# Patient Record
Sex: Male | Born: 1960 | Race: White | Hispanic: Yes | Marital: Married | Smoking: Current some day smoker
Health system: Southern US, Community
[De-identification: ages and names within clinical notes are randomized; demographics above are authoritative.]

---

## 2000-05-16 ENCOUNTER — Emergency Department (HOSPITAL_COMMUNITY): Admission: EM | Admit: 2000-05-16 | Discharge: 2000-05-16 | Payer: Self-pay | Admitting: Emergency Medicine

## 2001-05-08 ENCOUNTER — Inpatient Hospital Stay (HOSPITAL_COMMUNITY): Admission: EM | Admit: 2001-05-08 | Discharge: 2001-05-10 | Payer: Self-pay | Admitting: Emergency Medicine

## 2002-06-22 ENCOUNTER — Emergency Department (HOSPITAL_COMMUNITY): Admission: EM | Admit: 2002-06-22 | Discharge: 2002-06-22 | Payer: Self-pay | Admitting: Emergency Medicine

## 2002-06-25 ENCOUNTER — Emergency Department (HOSPITAL_COMMUNITY): Admission: EM | Admit: 2002-06-25 | Discharge: 2002-06-25 | Payer: Self-pay | Admitting: Emergency Medicine

## 2003-12-11 ENCOUNTER — Emergency Department (HOSPITAL_COMMUNITY): Admission: EM | Admit: 2003-12-11 | Discharge: 2003-12-11 | Payer: Self-pay | Admitting: Emergency Medicine

## 2004-12-01 ENCOUNTER — Emergency Department (HOSPITAL_COMMUNITY): Admission: EM | Admit: 2004-12-01 | Discharge: 2004-12-01 | Payer: Self-pay | Admitting: Emergency Medicine

## 2009-06-01 ENCOUNTER — Emergency Department (HOSPITAL_COMMUNITY): Admission: EM | Admit: 2009-06-01 | Discharge: 2009-06-02 | Payer: Self-pay | Admitting: Emergency Medicine

## 2010-09-25 ENCOUNTER — Emergency Department (HOSPITAL_COMMUNITY): Payer: Self-pay

## 2010-09-25 ENCOUNTER — Emergency Department (HOSPITAL_COMMUNITY)
Admission: EM | Admit: 2010-09-25 | Discharge: 2010-09-25 | Disposition: A | Discharge status: Home / Self Care | Payer: Self-pay | Attending: Emergency Medicine | Admitting: Emergency Medicine

## 2010-09-25 DIAGNOSIS — S022XXA Fracture of nasal bones, initial encounter for closed fracture: Secondary | ICD-10-CM | POA: Insufficient documentation

## 2010-09-25 DIAGNOSIS — IMO0002 Reserved for concepts with insufficient information to code with codable children: Secondary | ICD-10-CM | POA: Insufficient documentation

## 2010-09-25 DIAGNOSIS — S0003XA Contusion of scalp, initial encounter: Secondary | ICD-10-CM | POA: Insufficient documentation

## 2010-09-25 DIAGNOSIS — S0990XA Unspecified injury of head, initial encounter: Secondary | ICD-10-CM | POA: Insufficient documentation

## 2010-09-25 DIAGNOSIS — X58XXXA Exposure to other specified factors, initial encounter: Secondary | ICD-10-CM | POA: Insufficient documentation

## 2010-09-25 DIAGNOSIS — S1093XA Contusion of unspecified part of neck, initial encounter: Secondary | ICD-10-CM | POA: Insufficient documentation

## 2015-03-31 ENCOUNTER — Emergency Department (HOSPITAL_COMMUNITY)
Admission: EM | Admit: 2015-03-31 | Discharge: 2015-03-31 | Disposition: A | Discharge status: Home / Self Care | Payer: Self-pay | Attending: Emergency Medicine | Admitting: Emergency Medicine

## 2015-03-31 ENCOUNTER — Encounter (HOSPITAL_COMMUNITY): Payer: Self-pay | Admitting: *Deleted

## 2015-03-31 DIAGNOSIS — R21 Rash and other nonspecific skin eruption: Secondary | ICD-10-CM

## 2015-03-31 DIAGNOSIS — F17200 Nicotine dependence, unspecified, uncomplicated: Secondary | ICD-10-CM | POA: Insufficient documentation

## 2015-03-31 DIAGNOSIS — L509 Urticaria, unspecified: Secondary | ICD-10-CM | POA: Insufficient documentation

## 2015-03-31 MED ORDER — DIPHENHYDRAMINE HCL 25 MG PO TABS: 25.0000 mg | ORAL_TABLET | Freq: Four times a day (QID) | ORAL | Status: DC

## 2015-03-31 MED ORDER — PREDNISONE 50 MG PO TABS: ORAL_TABLET | ORAL | Status: DC

## 2015-03-31 NOTE — Discharge Instructions (Signed)
Use the benadryl and prednisone as prescribed. [facility] if [facility]. Return to ED with fevers, difficulty breathing, difficulty swallowing, swelling of the face or any other new or concerning symptoms

## 2015-03-31 NOTE — ED Provider Notes (Signed)
CSN: [id number]     Arrival date & time 03/31/15  1417 History  By signing my name below, I, Jaime Acevedo, attest that this documentation has been prepared under the direction and in the presence of Jaime Heimlich, PA-C Electronically Signed: Soijett Acevedo, ED Scribe. 03/31/2015. 3:59 PM.   Chief Complaint  Patient presents with  . Rash   The history is provided by the patient. A language interpreter was used ([place]).   Jaime Acevedo is a 54 y.o. male who presents to the Emergency Department complaining of itchy, red, rash to his torso and bilateral arms x yesterday afternoon. The rash is itchy but is not painful. The rash has not spread. Pt thinks that he ate pork and beef yesterday which he thinks is contributing to the rash, although these foods are not new for him. Pt denies new soaps/medications/pets/environment/lotion/detergent or other recent changes to his daily routine. He notes that he has tried abx and benadryl this morning with no relief of his symptoms. He denies wound, SOB, CP, difficulty breathing, difficulty swallowing and any other symptoms. Denies anyone at home with similar rash.   History reviewed. No pertinent past medical history. History reviewed. No pertinent past surgical history. History reviewed. No pertinent family history. Social History  Substance Use Topics  . Smoking status: Current Some Day Smoker  . Smokeless tobacco: None  . Alcohol Use: Yes     Comment: occ    Review of Systems  Skin: Positive for rash (itchy rash to torso and bilateral arms).  All other systems reviewed and are negative.     Allergies  Review of patient's allergies indicates no known allergies.  Home Medications   Prior to Admission medications   Medication Sig Start Date End Date Taking? Authorizing Provider  diphenhydrAMINE (BENADRYL) 25 MG tablet Take 1 tablet (25 mg total) by mouth every 6 (six) hours. 03/31/15   Rolm Gala Narciso Stoutenburg, PA-C  predniSONE (DELTASONE) 50  MG tablet Take 1 tablet once a day for 5 days 03/31/15   Braydn Carneiro, PA-C   BP 150/85 mmHg  Pulse 69  Temp(Src) 98.7 F (37.1 C) (Oral)  Resp 20  SpO2 98% Physical Exam  Constitutional: He appears well-developed and well-nourished. No distress.  HENT:  Head: Normocephalic and atraumatic.  Right Ear: External ear normal.  Left Ear: External ear normal.  Mouth/Throat: Uvula is midline, oropharynx is clear and moist and mucous membranes are normal. Mucous membranes are not dry. No posterior oropharyngeal edema or posterior oropharyngeal erythema.  No swelling of the eyelids, lips or tongue.   Eyes: Conjunctivae are normal. Right eye exhibits no discharge. Left eye exhibits no discharge. No scleral icterus.  Neck: Normal range of motion. Neck supple.  No stridor. FROM of neck intact.   Cardiovascular: Normal rate, regular rhythm and normal heart sounds.   Pulmonary/Chest: Effort normal and breath sounds normal. No respiratory distress. He has no wheezes. He has no rales.  Breathing unlabored. Lungs CTAB  Abdominal: Soft. He exhibits no distension. There is no tenderness. There is no rebound and no guarding.  Musculoskeletal: Normal range of motion.  Moves all extremities spontaneously  Neurological: He is alert. Coordination normal.  Skin: Skin is warm and dry. Rash noted.     Diffuse urticaria over abdomen, bilateral flanks and bilateral upper arms. Rash is not TTP. No vesicles, pustules, desquamation, abscesses or bulla overlying rash.   Psychiatric: He has a normal mood and affect. His behavior is normal.  Nursing note and vitals  reviewed.   ED Course  Procedures (including critical care time) DIAGNOSTIC STUDIES: Oxygen Saturation is 99% on RA, nl by my interpretation.    COORDINATION OF CARE: 3:58 PM Discussed treatment plan with pt at bedside and pt agreed to plan.    Labs Review Labs Reviewed - No data to display  Imaging Review No results found.    EKG  Interpretation None      MDM   Final diagnoses:  Rash  Urticaria   Rash consistent with urticaria. Patient denies any difficulty breathing or swallowing.  Pt has a patent airway without stridor and is handling secretions without difficulty; no angioedema. No blisters, no pustules, no warmth, no draining sinus tracts, no superficial abscesses, no bullous impetigo, no vesicles, no desquamation, no target lesions with dusky purpura or a central bulla. Not tender to touch. No concern for superimposed infection. No concern for SJS, TEN, TSS, tick borne illness, syphilis or other life-threatening condition. Will discharge home with short course of steroids and benadryl. Return precautions given in discharge paperwork and discussed with pt at bedside. Pt stable for discharge  I personally performed the services described in this documentation, which was scribed in my presence. The recorded information has been reviewed and is accurate.    Jaime HeimlichStevi Jadrien Narine, PA-C 03/31/15 1628  Eber HongBrian Miller, MD 04/02/15 1004

## 2015-03-31 NOTE — ED Notes (Signed)
Declined W/C at D/C and was escorted to lobby by RN. 

## 2015-03-31 NOTE — ED Notes (Signed)
Pt reports rash and itching to torso and arms since last night.

## 2015-04-07 ENCOUNTER — Encounter (HOSPITAL_COMMUNITY): Payer: Self-pay | Admitting: Emergency Medicine

## 2015-04-07 ENCOUNTER — Emergency Department (HOSPITAL_COMMUNITY)
Admission: EM | Admit: 2015-04-07 | Discharge: 2015-04-07 | Disposition: A | Discharge status: Home / Self Care | Payer: Self-pay | Attending: Emergency Medicine | Admitting: Emergency Medicine

## 2015-04-07 DIAGNOSIS — Y9389 Activity, other specified: Secondary | ICD-10-CM | POA: Insufficient documentation

## 2015-04-07 DIAGNOSIS — Y9289 Other specified places as the place of occurrence of the external cause: Secondary | ICD-10-CM | POA: Insufficient documentation

## 2015-04-07 DIAGNOSIS — T7840XA Allergy, unspecified, initial encounter: Secondary | ICD-10-CM | POA: Insufficient documentation

## 2015-04-07 DIAGNOSIS — F17200 Nicotine dependence, unspecified, uncomplicated: Secondary | ICD-10-CM | POA: Insufficient documentation

## 2015-04-07 DIAGNOSIS — Y998 Other external cause status: Secondary | ICD-10-CM | POA: Insufficient documentation

## 2015-04-07 DIAGNOSIS — X58XXXA Exposure to other specified factors, initial encounter: Secondary | ICD-10-CM | POA: Insufficient documentation

## 2015-04-07 MED ORDER — PREDNISONE 20 MG PO TABS: 20.0000 mg | ORAL_TABLET | Freq: Every day | ORAL | Status: AC

## 2015-04-07 MED ORDER — PREDNISONE 20 MG PO TABS
20.0000 mg | ORAL_TABLET | Freq: Once | ORAL | Status: AC
  Administered 2015-04-07: 20 mg via ORAL
  Filled 2015-04-07: qty 1

## 2015-04-07 MED ORDER — DIPHENHYDRAMINE HCL 25 MG PO CAPS
25.0000 mg | ORAL_CAPSULE | Freq: Once | ORAL | Status: AC
  Administered 2015-04-07: 25 mg via ORAL
  Filled 2015-04-07: qty 1

## 2015-04-07 MED ORDER — FAMOTIDINE 20 MG PO TABS
20.0000 mg | ORAL_TABLET | Freq: Once | ORAL | Status: AC
Start: 2015-04-07 — End: 2015-04-07
  Administered 2015-04-07: 20 mg via ORAL
  Filled 2015-04-07: qty 1

## 2015-04-07 MED ORDER — FAMOTIDINE 20 MG PO TABS: 20.0000 mg | ORAL_TABLET | Freq: Every day | ORAL | Status: AC

## 2015-04-07 MED ORDER — DIPHENHYDRAMINE HCL 25 MG PO CAPS: 25.0000 mg | ORAL_CAPSULE | Freq: Four times a day (QID) | ORAL | Status: AC | PRN

## 2015-04-07 NOTE — ED Provider Notes (Signed)
CSN: [id number]     Arrival date & time 04/07/15  4132 History   First MD Initiated Contact with Patient 04/07/15 0759     Chief Complaint  Patient presents with  . Rash     (Consider location/radiation/quality/duration/timing/severity/associated sxs/prior Treatment) HPI  She is a 54 year old malewith a past medical history who presents to the emergency department complaining of an itching, red rash across his torso. Patient reports that the rash started on 03/30/15. Patient was evaluated in the emergency department the following day. He was started on steroids and Benadryl, told to return to the emergency department if the rash did not improve following treatment. States the rash improved a mild amount since that time but continues to have itching. Denies shortness of breath, chest pain, difficulty breathing, difficulty swallowing, nausea, vomiting, abdominal pain. Nobody else in the household has a similar rash. Does not take any medications outside of the steroids and Benadryl. Denies having a similar rash in the past. Denies any new soaps, pets, detergents, lotions.   History reviewed. No pertinent past medical history. History reviewed. No pertinent past surgical history. History reviewed. No pertinent family history. Social History  Substance Use Topics  . Smoking status: Current Some Day Smoker  . Smokeless tobacco: None  . Alcohol Use: Yes     Comment: occ    Review of Systems  Constitutional: Negative for fever and appetite change.  HENT: Negative for congestion.   Eyes: Negative for visual disturbance.  Respiratory: Negative for chest tightness, shortness of breath and wheezing.   Cardiovascular: Negative for chest pain, palpitations and leg swelling.  Gastrointestinal: Negative for nausea, vomiting and abdominal pain.  Genitourinary: Negative for dysuria, flank pain and decreased urine volume.  Musculoskeletal: Negative for back pain.  Skin: Positive for rash. Negative for  wound.  Neurological: Negative for dizziness, seizures, weakness, light-headedness and headaches.  Psychiatric/Behavioral: Negative for behavioral problems.      Allergies  Review of patient's allergies indicates no known allergies.  Home Medications   Prior to Admission medications   Medication Sig Start Date End Date Taking? Authorizing Provider  diphenhydrAMINE (BENADRYL) 25 mg capsule Take 1 capsule (25 mg total) by mouth every 6 (six) hours as needed for itching. 04/07/15 04/14/15  Corena Herter, MD  famotidine (PEPCID) 20 MG tablet Take 1 tablet (20 mg total) by mouth daily. 04/07/15 04/21/15  Corena Herter, MD  predniSONE (DELTASONE) 20 MG tablet Take 1 tablet (20 mg total) by mouth daily with breakfast. 04/07/15 04/14/15  Corena Herter, MD   BP 153/106 mmHg  Pulse 64  Temp(Src) 97.9 F (36.6 C)  Resp 18  SpO2 98% Physical Exam  Constitutional: He is oriented to person, place, and time. He appears well-developed and well-nourished. No distress.  HENT:  Head: Normocephalic and atraumatic.  Mouth/Throat: Oropharynx is clear and moist.  Eyes: Conjunctivae and EOM are normal. Pupils are equal, round, and reactive to light.  Neck: Normal range of motion. Neck supple. No JVD present. No tracheal deviation present.  Cardiovascular: Normal rate, regular rhythm, normal heart sounds and intact distal pulses.   Pulmonary/Chest: Effort normal and breath sounds normal. No respiratory distress. He has no wheezes. He exhibits no tenderness.  Abdominal: Soft. Bowel sounds are normal. He exhibits no distension. There is no tenderness. There is no rebound.  Musculoskeletal: Normal range of motion.  Neurological: He is alert and oriented to person, place, and time.  Skin: Skin is warm. Rash noted. Rash is urticarial (across lower torso and  bilateral arms).  Psychiatric: He has a normal mood and affect.  Nursing note and vitals reviewed.   ED Course  Procedures (including critical care  time) Labs Review Labs Reviewed - No data to display  Imaging Review No results found. I have personally reviewed and evaluated these images and lab results as part of my medical decision-making.   EKG Interpretation None      MDM   Final diagnoses:  Allergic reaction, initial encounter   Patient is a 54 year old male with no significant past medical history who presents to the emergency department with a pruritic rash of his lower abdomen.On arrival no acute distress, not ill appearing. Afebrile, hemodynamic likely stable. Patient has been treated with a 4 day course of prednisone 40 mg daily and benadryl. Minimal improvement of rash. Patient without signs or symptoms of anaphylaxis. Does not require epinephrine at this time. Patient initially did not presents with anaphylaxis. Uncertain the cause of the patient's allergic reaction, no medication changes. No signs of contact dermatitis. Patient given a prescription for prednisone 20 mg daily, did not tolerate higher dose of steroid. Given prescription for steroids, zantac, benadryl. Given follow-up information for an allergy clinic for allergy testing. Given strict return precautions to the emergency department. Patient expressed understanding, no questions or concerns at time of discharge.      Corena HerterShannon Dmari Schubring, MD 04/07/15 [id number]  Gerhard Munchobert Lockwood, MD 04/10/15 2015

## 2015-04-07 NOTE — ED Notes (Signed)
Pt here today with recurrent rash , pt was here on the 28th and given prednisone and benadryl he stated that the rash had got better but came back

## 2016-07-20 ENCOUNTER — Emergency Department (HOSPITAL_COMMUNITY)
Admission: EM | Admit: 2016-07-20 | Discharge: 2016-07-20 | Disposition: A | Discharge status: Home / Self Care | Payer: Self-pay | Attending: Emergency Medicine | Admitting: Emergency Medicine

## 2016-07-20 ENCOUNTER — Encounter (HOSPITAL_COMMUNITY): Payer: Self-pay | Admitting: *Deleted

## 2016-07-20 DIAGNOSIS — H5789 Other specified disorders of eye and adnexa: Secondary | ICD-10-CM

## 2016-07-20 DIAGNOSIS — F17200 Nicotine dependence, unspecified, uncomplicated: Secondary | ICD-10-CM | POA: Insufficient documentation

## 2016-07-20 DIAGNOSIS — R21 Rash and other nonspecific skin eruption: Secondary | ICD-10-CM | POA: Insufficient documentation

## 2016-07-20 DIAGNOSIS — H578 Other specified disorders of eye and adnexa: Secondary | ICD-10-CM | POA: Insufficient documentation

## 2016-07-20 MED ORDER — KETOCONAZOLE 2 % EX CREA: 1.0000 "application " | TOPICAL_CREAM | Freq: Every day | CUTANEOUS | 0 refills | Status: AC

## 2016-07-20 MED ORDER — NAPHAZOLINE-PHENIRAMINE 0.025-0.3 % OP SOLN
1.0000 [drp] | OPHTHALMIC | 0 refills | Status: AC | PRN
Start: 2016-07-20 — End: ?

## 2016-07-20 MED ORDER — TRIAMCINOLONE ACETONIDE 0.1 % EX CREA: 1.0000 "application " | TOPICAL_CREAM | Freq: Two times a day (BID) | CUTANEOUS | 0 refills | Status: AC

## 2016-07-20 NOTE — Discharge Instructions (Signed)
You can use the ointment as prescribed to help with the rash in your groin region. Eyedrops as prescribed to help with irritation in your eyes. It is also important for you to wear protective eye wear while working. With your doctor. Return to ED for new or worsening symptoms.

## 2016-07-20 NOTE — ED Provider Notes (Signed)
[facility]-EMERGENCY DEPT Provider Note   CSN: [id number] Arrival date & time: 07/20/16  4098  By signing my name below, I, Rosario Adie, attest that this documentation has been prepared under the direction and in the presence of Joycie Peek, PA-C.  Electronically Signed: Rosario Adie, ED Scribe. 07/20/16. 11:10 AM.  History   Chief Complaint Chief Complaint  Patient presents with  . Eye Problem   The history is provided by the patient. No language interpreter was used.   HPI Comments: Jeremian Treylen Gibbs is a 56 y.o. male with no pertinent PMHx, who presents to the Emergency Department complaining of persistent bilateral eye Itchiness beginning two weeks ago. 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 No treatments for this issue were tried prior to coming into the ED. He denies eye pain, vision changes or any other associated symptoms.  He is secondarily c/o intermittent groin itchiness beginning "25 days ago". Pt has been applying "some cream" with some relief of this issue. He denies testicular pain, testicular swelling, fever, chills.    History reviewed. No pertinent past medical history.  There are no active problems to display for this patient.  History reviewed. No pertinent surgical history.  Home Medications    Prior to Admission medications   Medication Sig Start Date End Date Taking? Authorizing Provider  diphenhydrAMINE (BENADRYL) 25 mg capsule Take 1 capsule (25 mg total) by mouth every 6 (six) hours as needed for itching. 04/07/15 04/14/15  Corena Herter, MD  famotidine (PEPCID) 20 MG tablet Take 1 tablet (20 mg total) by mouth daily. 04/07/15 04/21/15  Corena Herter, MD  ketoconazole (NIZORAL) 2 % cream Apply 1 application topically daily. 07/20/16   Joycie Peek, PA-C  naphazoline-pheniramine (NAPHCON-A) 0.025-0.3 % ophthalmic solution Place 1 drop into both eyes every 4  (four) hours as needed for irritation. 07/20/16   Joycie Peek, PA-C  triamcinolone cream (KENALOG) 0.1 % Apply 1 application topically 2 (two) times daily. 07/20/16   Joycie Peek, PA-C   Family History History reviewed. No pertinent family history.  Social History Social History  Substance Use Topics  . Smoking status: Current Some Day Smoker  . Smokeless tobacco: Never Used  . Alcohol use Yes     Comment: occ   Allergies   Patient has no known allergies.  Review of Systems Review of Systems A complete review of systems was obtained and all systems are negative except as noted in the HPI and PMH.   Physical Exam Updated Vital Signs BP (!) 152/81 (BP Location: Left Arm)   Pulse 69   Temp 98 F (36.7 C) (Oral)   Resp 16   SpO2 99%   Physical Exam  Constitutional: He appears well-developed and well-nourished. No distress.  HENT:  Head: Normocephalic and atraumatic.  Eyes: Conjunctivae and EOM are normal. Pupils are equal, round, and reactive to light. Right eye exhibits no discharge. Left eye exhibits no discharge. No scleral icterus.  Neck: Normal range of motion.  Cardiovascular: Normal rate.   Pulmonary/Chest: Effort normal.  Abdominal: He exhibits no distension.  Genitourinary:  Genitourinary Comments: Chaperone present throughout entire exam.  Bilateral medial thigh with keratotic lesions that are hyperpigmented at same level. No genital lesions.  Musculoskeletal: Normal range of motion.  Neurological: He is alert.  Skin: No pallor.  Psychiatric: He has a normal mood and affect. His behavior is normal.  Nursing note and vitals reviewed.  ED Treatments /  Results  DIAGNOSTIC STUDIES: Oxygen Saturation is 99% on RA, normal by my interpretation.   COORDINATION OF CARE: 11:10 AM-Discussed next steps with pt. Pt verbalized understanding and is agreeable with the plan.   Labs (all labs ordered are listed, but only abnormal results are displayed) Labs  Reviewed - No data to display  EKG  EKG Interpretation None      Radiology No results found.  Procedures Procedures   Medications Ordered in ED Medications - No data to display  Initial Impression / Assessment and Plan / ED Course  I have reviewed the triage vital signs and the nursing notes.  Pertinent labs & imaging results that were available during my care of the patient were reviewed by me and considered in my medical decision making (see chart for details).    Naphcon for eye itchiness, discussed wearing protective eyewear. Rash with possible fungal component versus friction of legs/clothing. Rx triamcinolone and ketoconazole. Discussed continued hygiene. Follow-up with PCP. Return precautions discussed  Final Clinical Impressions(s) / ED Diagnoses   Final diagnoses:  Eye irritation  Rash of genital area   New Prescriptions Discharge Medication List as of 07/20/2016 11:52 AM    START taking these medications   Details  ketoconazole (NIZORAL) 2 % cream Apply 1 application topically daily., Starting Tue 07/20/2016, Print    naphazoline-pheniramine (NAPHCON-A) 0.025-0.3 % ophthalmic solution Place 1 drop into both eyes every 4 (four) hours as needed for irritation., Starting Tue 07/20/2016, Print    triamcinolone cream (KENALOG) 0.1 % Apply 1 application topically 2 (two) times daily., Starting Tue 07/20/2016, Print       I personally performed the services described in this documentation, which was scribed in my presence. The recorded information has been reviewed and is accurate.     Joycie PeekBenjamin Sharnell Knight, PA-C 07/20/16 1555    Gerhard Munchobert Lockwood, MD 07/20/16 [phone]

## 2016-07-20 NOTE — ED Triage Notes (Signed)
c/o both eyes itching and also itching in his groin area onset 1 month ago

## 2021-01-27 ENCOUNTER — Other Ambulatory Visit: Payer: Self-pay

## 2021-01-27 ENCOUNTER — Emergency Department (HOSPITAL_COMMUNITY)
Admission: EM | Admit: 2021-01-27 | Discharge: 2021-01-28 | Disposition: A | Discharge status: Home / Self Care | Payer: Self-pay | Attending: Emergency Medicine | Admitting: Emergency Medicine

## 2021-01-27 ENCOUNTER — Emergency Department (HOSPITAL_COMMUNITY): Payer: Self-pay

## 2021-01-27 DIAGNOSIS — W11XXXA Fall on and from ladder, initial encounter: Secondary | ICD-10-CM | POA: Insufficient documentation

## 2021-01-27 DIAGNOSIS — W19XXXA Unspecified fall, initial encounter: Secondary | ICD-10-CM

## 2021-01-27 DIAGNOSIS — F17200 Nicotine dependence, unspecified, uncomplicated: Secondary | ICD-10-CM | POA: Insufficient documentation

## 2021-01-27 DIAGNOSIS — S31010A Laceration without foreign body of lower back and pelvis without penetration into retroperitoneum, initial encounter: Secondary | ICD-10-CM | POA: Insufficient documentation

## 2021-01-27 DIAGNOSIS — Z23 Encounter for immunization: Secondary | ICD-10-CM | POA: Insufficient documentation

## 2021-01-27 MED ORDER — TETANUS-DIPHTH-ACELL PERTUSSIS 5-2.5-18.5 LF-MCG/0.5 IM SUSY
0.5000 mL | PREFILLED_SYRINGE | Freq: Once | INTRAMUSCULAR | Status: AC
  Administered 2021-01-28: 0.5 mL via INTRAMUSCULAR
  Filled 2021-01-27: qty 0.5

## 2021-01-27 NOTE — ED Triage Notes (Signed)
Pt c/o lac to R lower abd/flank. States he was on a ladder, fell approx 4-45ft onto dirt, "scraped" ladder on the way down. Bleeding oozing, controlled in triage.   Pt states 2/10 pain, "doesn't hurt unless you touch it."  Tetanus not up to date

## 2021-01-28 ENCOUNTER — Emergency Department (HOSPITAL_COMMUNITY): Payer: Self-pay

## 2021-01-28 MED ORDER — LIDOCAINE-EPINEPHRINE (PF) 2 %-1:200000 IJ SOLN
10.0000 mL | Freq: Once | INTRAMUSCULAR | Status: AC
  Administered 2021-01-28: 10 mL via INTRADERMAL
  Filled 2021-01-28: qty 20

## 2021-01-28 NOTE — ED Notes (Signed)
Pt discharged and wheeled out of the ED in a wheel chair without difficulty. 

## 2021-01-28 NOTE — ED Provider Notes (Signed)
[facility] EMERGENCY DEPARTMENT Provider Note   CSN: [id number] Arrival date & time: 01/27/21  1617     History Chief Complaint  Patient presents with   Laceration    Grant Ziad Maye is a 60 y.o. male.  60 yo M with a chief complaints of falling down a ladder.  Larey Seat about 4 to 6 feet and scraped the ladder on the way down.  Suffered a laceration to the right side of his body.  Denies any abdominal pain.  Is been able to walk afterwards without issue.  Denies head injury or loss consciousness.  Feels that the area was mostly clean on the ladder.  The history is provided by the patient and a friend. The history is limited by a language barrier. A language interpreter was used.  Laceration Location:  Pelvis Pelvic laceration location:  R hip Length:  5.6 Depth:  Through dermis Quality: jagged   Bleeding: venous   Time since incident:  12 hours Laceration mechanism:  Blunt object Pain details:    Quality:  Aching and burning   Severity:  Mild   Timing:  Constant   Progression:  Unchanged Foreign body present:  No foreign bodies Relieved by:  Nothing Worsened by:  Movement and pressure Ineffective treatments:  None tried Tetanus status:  Unknown Associated symptoms: no fever and no rash       No past medical history on file.  There are no problems to display for this patient.   No past surgical history on file.     No family history on file.  Social History   Tobacco Use   Smoking status: Some Days   Smokeless tobacco: Never  Substance Use Topics   Alcohol use: Yes    Comment: occ   Drug use: No    Home Medications Prior to Admission medications   Medication Sig Start Date End Date Taking? Authorizing Provider  diphenhydrAMINE (BENADRYL) 25 mg capsule Take 1 capsule (25 mg total) by mouth every 6 (six) hours as needed for itching. 04/07/15 04/14/15  Corena Herter, MD  famotidine (PEPCID) 20 MG tablet Take 1 tablet (20 mg  total) by mouth daily. 04/07/15 04/21/15  Corena Herter, MD  ketoconazole (NIZORAL) 2 % cream Apply 1 application topically daily. 07/20/16   Cartner, Sharlet Salina, PA-C  naphazoline-pheniramine (NAPHCON-A) 0.025-0.3 % ophthalmic solution Place 1 drop into both eyes every 4 (four) hours as needed for irritation. 07/20/16   Cartner, Sharlet Salina, PA-C  triamcinolone cream (KENALOG) 0.1 % Apply 1 application topically 2 (two) times daily. 07/20/16   Joycie Peek, PA-C    Allergies    Patient has no known allergies.  Review of Systems   Review of Systems  Constitutional:  Negative for chills and fever.  HENT:  Negative for congestion and facial swelling.   Eyes:  Negative for discharge and visual disturbance.  Respiratory:  Negative for shortness of breath.   Cardiovascular:  Negative for chest pain and palpitations.  Gastrointestinal:  Negative for abdominal pain, diarrhea and vomiting.  Musculoskeletal:  Negative for arthralgias and myalgias.  Skin:  Positive for wound. Negative for color change and rash.  Neurological:  Negative for tremors, syncope and headaches.  Psychiatric/Behavioral:  Negative for confusion and dysphoric mood.    Physical Exam Updated Vital Signs BP (!) 152/89 (BP Location: Right Arm)   Pulse 99   Temp 98.1 F (36.7 C) (Oral)   Resp 16   SpO2 98%   Physical Exam Vitals and nursing  note reviewed.  Constitutional:      Appearance: He is well-developed.  HENT:     Head: Normocephalic and atraumatic.  Eyes:     Pupils: Pupils are equal, round, and reactive to light.  Neck:     Vascular: No JVD.  Cardiovascular:     Rate and Rhythm: Normal rate and regular rhythm.     Heart sounds: No murmur heard.   No friction rub. No gallop.  Pulmonary:     Effort: No respiratory distress.     Breath sounds: No wheezing.  Abdominal:     General: There is no distension.     Tenderness: There is no abdominal tenderness. There is no guarding or rebound.  Musculoskeletal:         General: Normal range of motion.     Cervical back: Normal range of motion and neck supple.     Comments: 2 lacerations overlying the iliac crest on the right.  Puncture wounds.  Mild edema.  Trace venous oozing.  No bony tenderness.  No tenderness on palpation of the abdomen.  Skin:    Coloration: Skin is not pale.     Findings: No rash.  Neurological:     Mental Status: He is alert and oriented to person, place, and time.  Psychiatric:        Behavior: Behavior normal.    ED Results / Procedures / Treatments   Labs (all labs ordered are listed, but only abnormal results are displayed) Labs Reviewed - No data to display  EKG None  Radiology DG Shoulder Right  Result Date: 01/27/2021 CLINICAL DATA:  Larey Seat off ladder EXAM: RIGHT SHOULDER - 2+ VIEW COMPARISON:  None. FINDINGS: No fracture or malalignment. Mild AC joint degenerative change. The right lung is clear. IMPRESSION: No acute osseous abnormality Electronically Signed   By: Jasmine Pang M.D.   On: 01/27/2021 17:43   DG Pelvis Portable  Result Date: 01/28/2021 CLINICAL DATA:  Laceration.  Evaluation for foreign body. EXAM: PORTABLE PELVIS 1-2 VIEWS COMPARISON:  None. FINDINGS: There is no evidence of pelvic fracture or diastasis. No pelvic bone lesions are seen. IMPRESSION: Negative. Electronically Signed   By: Deatra Robinson M.D.   On: 01/28/2021 01:25    Procedures .Marland KitchenLaceration Repair  Date/Time: 01/28/2021 1:46 AM Performed by: Melene Plan, DO Authorized by: Melene Plan, DO   Consent:    Consent obtained:  Verbal   Consent given by:  Patient   Risks, benefits, and alternatives were discussed: yes     Risks discussed:  Infection, pain, need for additional repair, poor cosmetic result and poor wound healing   Alternatives discussed:  No treatment, delayed treatment and observation Universal protocol:    Imaging studies available: yes     Immediately prior to procedure, a time out was called: yes     Patient  identity confirmed:  Verbally with patient Anesthesia:    Anesthesia method:  Local infiltration   Local anesthetic:  Lidocaine 2% WITH epi Laceration details:    Location:  Pelvis   Pelvis location:  R hip   Length (cm):  5.7 Pre-procedure details:    Preparation:  Patient was prepped and draped in usual sterile fashion and imaging obtained to evaluate for foreign bodies Exploration:    Limited defect created (wound extended): no     Hemostasis achieved with:  Epinephrine   Imaging obtained: x-ray     Imaging outcome: foreign body not noted     Wound exploration: entire depth  of wound visualized     Wound extent: no muscle damage noted and no underlying fracture noted     Contaminated: no   Treatment:    Area cleansed with:  Saline   Amount of cleaning:  Extensive   Irrigation solution:  Sterile saline   Irrigation volume:  150   Irrigation method:  Pressure wash   Visualized foreign bodies/material removed: no     Debridement:  None   Undermining:  None Skin repair:    Repair method:  Sutures   Suture size:  3-0   Suture material:  Nylon   Suture technique:  Simple interrupted   Number of sutures:  4 Approximation:    Approximation:  Close Repair type:    Repair type:  Simple Post-procedure details:    Dressing:  Adhesive bandage and antibiotic ointment   Procedure completion:  Tolerated well, no immediate complications   Medications Ordered in ED Medications  Tdap (BOOSTRIX) injection 0.5 mL (0.5 mLs Intramuscular Given 01/28/21 0006)  lidocaine-EPINEPHrine (XYLOCAINE W/EPI) 2 %-1:200000 (PF) injection 10 mL (10 mLs Intradermal Given by Other 01/28/21 0112)    ED Course  I have reviewed the triage vital signs and the nursing notes.  Pertinent labs & imaging results that were available during my care of the patient were reviewed by me and considered in my medical decision making (see chart for details).    MDM Rules/Calculators/A&P                           60  yo M with a chief complaints of a laceration after he fell off of a ladder.  This occurred earlier today.  Patient has a somewhat jagged and gaping laceration to the right side of his pelvis.  Plain film of the pelvis viewed by me without fracture or foreign body.  Thoroughly irrigated and repaired at bedside.  Tetanus was updated.  Discharged home.  1:48 AM:  I have discussed the diagnosis/risks/treatment options with the patient and friend  and believe the pt to be eligible for discharge home to follow-up with PCP. We also discussed returning to the ED immediately if new or worsening sx occur. We discussed the sx which are most concerning (e.g., sudden worsening pain, fever, inability to tolerate by mouth, redness, drainage) that necessitate immediate return. Medications administered to the patient during their visit and any new prescriptions provided to the patient are listed below.  Medications given during this visit Medications  Tdap (BOOSTRIX) injection 0.5 mL (0.5 mLs Intramuscular Given 01/28/21 0006)  lidocaine-EPINEPHrine (XYLOCAINE W/EPI) 2 %-1:200000 (PF) injection 10 mL (10 mLs Intradermal Given by Other 01/28/21 0112)     The patient appears reasonably screen and/or stabilized for discharge and I doubt any other medical condition or other [facility] requiring further screening, evaluation, or treatment in the ED at this time prior to discharge.   Final Clinical Impression(s) / ED Diagnoses Final diagnoses:  Laceration of pelvic region, initial encounter    Rx / DC Orders ED Discharge Orders     None        Melene Plan, DO 01/28/21 0148

## 2021-01-28 NOTE — Discharge Instructions (Signed)
Return for redness drainage or if you get a fever.  The sutures that were used typically are removed between day 7 and 10.   The area can get wet but not fully immersed underwater.  No scrubbing.  If you really want to clean it you can apply a half-and-half hydrogen peroxide solution with water on a Q-tip.  You can apply an ointment a couple times a day this could be as simple as Vaseline but could also be an antibiotic ointment if you wish..    Regrese por drenaje de enrojecimiento o si tiene fiebre. Las suturas que se usaron generalmente se retiran [employer] 7 y 10. El rea puede mojarse pero no sumergirse completamente bajo el agua. Sin frotar. Si realmente desea limpiarlo, puede aplicar una solucin de perxido de hidrgeno mitad y mitad con agua en un hisopo. Puede aplicar un ungento un par de veces al da, esto podra ser tan simple como la vaselina, pero tambin podra ser un ungento antibitico si lo desea.

## 2021-02-10 ENCOUNTER — Other Ambulatory Visit: Payer: Self-pay

## 2021-02-10 ENCOUNTER — Emergency Department (HOSPITAL_COMMUNITY)
Admission: EM | Admit: 2021-02-10 | Discharge: 2021-02-10 | Disposition: A | Discharge status: Home / Self Care | Payer: Self-pay | Attending: Physician Assistant | Admitting: Physician Assistant

## 2021-02-10 ENCOUNTER — Encounter (HOSPITAL_COMMUNITY): Payer: Self-pay | Admitting: Emergency Medicine

## 2021-02-10 DIAGNOSIS — F17210 Nicotine dependence, cigarettes, uncomplicated: Secondary | ICD-10-CM | POA: Insufficient documentation

## 2021-02-10 DIAGNOSIS — L089 Local infection of the skin and subcutaneous tissue, unspecified: Secondary | ICD-10-CM

## 2021-02-10 DIAGNOSIS — T8140XA Infection following a procedure, unspecified, initial encounter: Secondary | ICD-10-CM | POA: Insufficient documentation

## 2021-02-10 DIAGNOSIS — T148XXA Other injury of unspecified body region, initial encounter: Secondary | ICD-10-CM

## 2021-02-10 DIAGNOSIS — Z4802 Encounter for removal of sutures: Secondary | ICD-10-CM | POA: Insufficient documentation

## 2021-02-10 DIAGNOSIS — M25511 Pain in right shoulder: Secondary | ICD-10-CM | POA: Insufficient documentation

## 2021-02-10 MED ORDER — DOXYCYCLINE HYCLATE 100 MG PO CAPS: 100.0000 mg | ORAL_CAPSULE | Freq: Two times a day (BID) | ORAL | 0 refills | Status: AC

## 2021-02-10 NOTE — ED Triage Notes (Signed)
Patient coming from home, wanting staples removed from flank injury on 9/27. VSS.

## 2021-02-10 NOTE — Discharge Instructions (Addendum)
You were given a prescription for antibiotics. Please take the antibiotic prescription fully.   Follow up with Dr. Carola Frost about your shoulder pain. Call the office to make an appointment    Please return to the emergency department for any new or worsening symptoms.   -----------------------------   Jaime Acevedo receta para antibiticos. Por favor, tome la prescripcin de antibiticos por completo.  Haga un seguimiento con el Dr. Elie Confer su dolor de hombro. Llame a la oficina para hacer una cita   Regrese al departamento de emergencias por cualquier sntoma nuevo o que empeore.

## 2021-02-10 NOTE — ED Provider Notes (Signed)
[facility] EMERGENCY DEPARTMENT Provider Note   CSN: [id number] Arrival date & time: 02/10/21  1511     History Chief Complaint  Patient presents with   Suture / Staple Removal    Jaime Acevedo is a 60 y.o. male.  HPI  60 year old male presents to the emergency department today for evaluation of suture removal.  He had sutures placed on 9/28 after he fell into a ladder and sustained a laceration.  At that time he also had x-rays of the pelvis and right shoulder.  He is complaining of continued right shoulder pain and decreased range of motion secondary to pain.  He denies any fevers or injuries.  History reviewed. No pertinent past medical history.  There are no problems to display for this patient.   History reviewed. No pertinent surgical history.     No family history on file.  Social History   Tobacco Use   Smoking status: Some Days   Smokeless tobacco: Never  Substance Use Topics   Alcohol use: Yes    Comment: occ   Drug use: No    Home Medications Prior to Admission medications   Medication Sig Start Date End Date Taking? Authorizing Provider  doxycycline (VIBRAMYCIN) 100 MG capsule Take 1 capsule (100 mg total) by mouth 2 (two) times daily for 7 days. 02/10/21 02/17/21 Yes Laci Frenkel S, PA-C  diphenhydrAMINE (BENADRYL) 25 mg capsule Take 1 capsule (25 mg total) by mouth every 6 (six) hours as needed for itching. 04/07/15 04/14/15  Corena Herter, MD  famotidine (PEPCID) 20 MG tablet Take 1 tablet (20 mg total) by mouth daily. 04/07/15 04/21/15  Corena Herter, MD  ketoconazole (NIZORAL) 2 % cream Apply 1 application topically daily. 07/20/16   Cartner, Sharlet Salina, PA-C  naphazoline-pheniramine (NAPHCON-A) 0.025-0.3 % ophthalmic solution Place 1 drop into both eyes every 4 (four) hours as needed for irritation. 07/20/16   Cartner, Sharlet Salina, PA-C  triamcinolone cream (KENALOG) 0.1 % Apply 1 application topically 2 (two) times daily.  07/20/16   Joycie Peek, PA-C    Allergies    Patient has no known allergies.  Review of Systems   Review of Systems  Constitutional:  Negative for fever.  Musculoskeletal:        Right shoulder pain  Skin:  Positive for wound.   Physical Exam Updated Vital Signs BP (!) 156/80 (BP Location: Right Arm)   Pulse 70   Temp 98.4 F (36.9 C) (Oral)   Resp 14   SpO2 100%   Physical Exam Constitutional:      General: He is not in acute distress.    Appearance: He is well-developed.  Eyes:     Conjunctiva/sclera: Conjunctivae normal.  Cardiovascular:     Rate and Rhythm: Normal rate and regular rhythm.  Pulmonary:     Effort: Pulmonary effort is normal.     Breath sounds: Normal breath sounds.  Skin:    General: Skin is warm and dry.     Comments: Sutures in place to the right lower abdomen/pelvis area with some mild surrounding induration and erythema, there is some scant purulent drainage from several suture sites but no drainable abscess identified.  Neurological:     Mental Status: He is alert and oriented to person, place, and time.    ED Results / Procedures / Treatments   Labs (all labs ordered are listed, but only abnormal results are displayed) Labs Reviewed - No data to display  EKG None  Radiology No results  found.  Procedures .Suture Removal  Date/Time: 02/10/2021 4:07 PM Performed by: Karrie Meres, PA-C Authorized by: Karrie Meres, PA-C   Consent:    Consent obtained:  Verbal   Consent given by:  Patient   Risks, benefits, and alternatives were discussed: yes     Risks discussed:  Bleeding, pain and wound separation   Alternatives discussed:  No treatment Universal protocol:    Procedure explained and questions answered to patient or proxy's satisfaction: yes     Immediately prior to procedure, a time out was called: yes     Patient identity confirmed:  Verbally with patient Location:    Location:  Trunk Procedure details:     Wound appearance:  Draining, purulent, red, warm and indurated   Number of sutures removed:  4 Post-procedure details:    Procedure completion:  Tolerated well, no immediate complications   Medications Ordered in ED Medications - No data to display  ED Course  I have reviewed the triage vital signs and the nursing notes.  Pertinent labs & imaging results that were available during my care of the patient were reviewed by me and considered in my medical decision making (see chart for details).    MDM Rules/Calculators/A&P                          Here for suture removal after he had sutures placed last week. Has some mild signs of surrounding cellulitis but no drainable abscess.  Sutures removed in the ED.  He is complaining of continued right shoulder pain from his fall.  Reviewed prior imaging and no evidence of fractures noted.  Given decreased range of motion and pain with range of motion he may have another injury such as rotator cuff tear.  Advised he will need to follow-up with orthopedics, referral given.  Advised on return precautions.  He was also given antibiotics for home.  He voiced understanding plan and reasons to return.  Questions answered.  Patient stable for discharge.   Final Clinical Impression(s) / ED Diagnoses Final diagnoses:  Visit for suture removal  Wound infection  Acute pain of right shoulder    Rx / DC Orders ED Discharge Orders          Ordered    doxycycline (VIBRAMYCIN) 100 MG capsule  2 times daily        02/10/21 [address], [place], PA-C 02/10/21 1611    Terald Sleeper, MD 02/10/21 1819

## 2021-02-10 NOTE — ED Notes (Signed)
Patient discharge instructions reviewed with the patient. The patient verbalized understanding of instructions. Patient discharged. 

## 2021-02-17 ENCOUNTER — Emergency Department (HOSPITAL_COMMUNITY)
Admission: EM | Admit: 2021-02-17 | Discharge: 2021-02-17 | Disposition: A | Discharge status: Home / Self Care | Payer: Self-pay | Attending: Emergency Medicine | Admitting: Emergency Medicine

## 2021-02-17 DIAGNOSIS — W11XXXA Fall on and from ladder, initial encounter: Secondary | ICD-10-CM | POA: Insufficient documentation

## 2021-02-17 DIAGNOSIS — M25511 Pain in right shoulder: Secondary | ICD-10-CM

## 2021-02-17 DIAGNOSIS — G8929 Other chronic pain: Secondary | ICD-10-CM

## 2021-02-17 DIAGNOSIS — F17200 Nicotine dependence, unspecified, uncomplicated: Secondary | ICD-10-CM | POA: Insufficient documentation

## 2021-02-17 DIAGNOSIS — M79601 Pain in right arm: Secondary | ICD-10-CM | POA: Insufficient documentation

## 2021-02-17 NOTE — ED Triage Notes (Signed)
Pt./translator stated, I had a shoulder injury and now no fracture but still having pain. Broke arm in Sept. 17.. I fell off a ladder.- Ive lost the function in my arm. I need medication and therapy .

## 2021-02-17 NOTE — Discharge Instructions (Signed)
Por favor, seguimiento con ortopedia, fisioterapia. Solicite sus registros de adquisicin de registros mdicos. Imprimiremos las notas que tenemos archivadas del, sin embargo, es posible que no contengan el nombre de su empleador.

## 2021-02-17 NOTE — ED Provider Notes (Signed)
[facility] EMERGENCY DEPARTMENT Provider Note   CSN: [id number] Arrival date & time: 02/17/21  0915     History Chief Complaint  Patient presents with   Shoulder Pain    Jaime Acevedo is a 60 y.o. male who reports after a fall off of a ladder on September 17.  Patient reports that he is continue to have difficulty with the arm and that he has decreased range of motion, not lift the arm above level.  Patient reports his main concern however is that his employer has stopped supporting him, is not giving him any money, and will not let him return to work.  Patient is talking to a lawyer, and does want his records so that he can pursue his boss.  Patient is not taking anything for pain at this time, denies any pain at rest, just has decreased range of motion and some pain if he attempts to lift the arm above his head.  Patient denies numbness or tingling of the arm.   Shoulder Pain     No past medical history on file.  There are no problems to display for this patient.   No past surgical history on file.     No family history on file.  Social History   Tobacco Use   Smoking status: Some Days   Smokeless tobacco: Never  Substance Use Topics   Alcohol use: Yes    Comment: occ   Drug use: No    Home Medications Prior to Admission medications   Medication Sig Start Date End Date Taking? Authorizing Provider  diphenhydrAMINE (BENADRYL) 25 mg capsule Take 1 capsule (25 mg total) by mouth every 6 (six) hours as needed for itching. 04/07/15 04/14/15  Corena Herter, MD  doxycycline (VIBRAMYCIN) 100 MG capsule Take 1 capsule (100 mg total) by mouth 2 (two) times daily for 7 days. 02/10/21 02/17/21  Couture, Cortni S, PA-C  famotidine (PEPCID) 20 MG tablet Take 1 tablet (20 mg total) by mouth daily. 04/07/15 04/21/15  Corena Herter, MD  ketoconazole (NIZORAL) 2 % cream Apply 1 application topically daily. 07/20/16   Cartner, Sharlet Salina, PA-C   naphazoline-pheniramine (NAPHCON-A) 0.025-0.3 % ophthalmic solution Place 1 drop into both eyes every 4 (four) hours as needed for irritation. 07/20/16   Cartner, Sharlet Salina, PA-C  triamcinolone cream (KENALOG) 0.1 % Apply 1 application topically 2 (two) times daily. 07/20/16   Joycie Peek, PA-C    Allergies    Patient has no known allergies.  Review of Systems   Review of Systems  Musculoskeletal:        Arm pain, decreased range of motion  All other systems reviewed and are negative.  Physical Exam Updated Vital Signs There were no vitals taken for this visit.  Physical Exam Vitals and nursing note reviewed.  Constitutional:      General: He is not in acute distress.    Appearance: Normal appearance.  HENT:     Head: Normocephalic and atraumatic.  Eyes:     General:        Right eye: No discharge.        Left eye: No discharge.  Cardiovascular:     Rate and Rhythm: Normal rate and regular rhythm.     Pulses: Normal pulses.  Pulmonary:     Effort: Pulmonary effort is normal. No respiratory distress.  Musculoskeletal:        General: No deformity.     Comments: Right arm without deformity, no tenderness to  palpation across the entire right arm, clavicle, shoulder, humeral head.  Patient has intact strength 5 out of 5, however has difficulty with active range of motion for abduction, and extension.  Patient has intact passive range of motion with some pain.  Skin:    General: Skin is warm and dry.     Capillary Refill: Capillary refill takes less than 2 seconds.  Neurological:     Mental Status: He is alert and oriented to person, place, and time.  Psychiatric:        Mood and Affect: Mood normal.        Behavior: Behavior normal.    ED Results / Procedures / Treatments   Labs (all labs ordered are listed, but only abnormal results are displayed) Labs Reviewed - No data to display  EKG None  Radiology No results found.  Procedures Procedures    Medications Ordered in ED Medications - No data to display  ED Course  I have reviewed the triage vital signs and the nursing notes.  Pertinent labs & imaging results that were available during my care of the patient were reviewed by me and considered in my medical decision making (see chart for details).    MDM Rules/Calculators/A&P                         Sequelae from injury sustained approximately 1 month ago.  Patient does have some decreased range of motion of the right arm, however it is neurovascularly intact.  No step-offs or deformity noted.  No numbness or tingling to suggest neurologic compromise.  Patient's primary concern is the legal implications of this injury, receiving compensation for injury from his boss.  There is some evidence of decreased range of motion, decreased strength with extension, and abduction, there is not evidence of acute injury.  Discussed patient should seek further evaluation with physical therapy, orthopedics, and that he can request his medical records, his boxes information from medical records acquisition.  Patient understands and agrees to plan.  Patient discharged in stable condition.  Return precautions are given. Final Clinical Impression(s) / ED Diagnoses Final diagnoses:  None    Rx / DC Orders ED Discharge Orders     None        [place] 02/17/21 1008    Mancel Bale, MD 02/18/21 1056

## 2021-05-01 ENCOUNTER — Emergency Department (HOSPITAL_COMMUNITY)
Admission: EM | Admit: 2021-05-01 | Discharge: 2021-05-01 | Disposition: A | Discharge status: Home / Self Care | Payer: Self-pay | Attending: Emergency Medicine | Admitting: Emergency Medicine

## 2021-05-01 ENCOUNTER — Other Ambulatory Visit: Payer: Self-pay

## 2021-05-01 DIAGNOSIS — R21 Rash and other nonspecific skin eruption: Secondary | ICD-10-CM | POA: Insufficient documentation

## 2021-05-01 DIAGNOSIS — L0291 Cutaneous abscess, unspecified: Secondary | ICD-10-CM

## 2021-05-01 DIAGNOSIS — L02416 Cutaneous abscess of left lower limb: Secondary | ICD-10-CM | POA: Insufficient documentation

## 2021-05-01 DIAGNOSIS — F17200 Nicotine dependence, unspecified, uncomplicated: Secondary | ICD-10-CM | POA: Insufficient documentation

## 2021-05-01 MED ORDER — SULFAMETHOXAZOLE-TRIMETHOPRIM 800-160 MG PO TABS: 1.0000 | ORAL_TABLET | Freq: Two times a day (BID) | ORAL | 0 refills | Status: AC

## 2021-05-01 MED ORDER — LIDOCAINE HCL 2 % IJ SOLN
10.0000 mL | Freq: Once | INTRAMUSCULAR | Status: AC
  Administered 2021-05-01: 21:00:00 200 mg
  Filled 2021-05-01: qty 20

## 2021-05-01 MED ORDER — HYDROCORTISONE 1 % EX CREA: TOPICAL_CREAM | CUTANEOUS | 0 refills | Status: DC

## 2021-05-01 MED ORDER — SULFAMETHOXAZOLE-TRIMETHOPRIM 800-160 MG PO TABS
1.0000 | ORAL_TABLET | Freq: Once | ORAL | Status: AC
  Administered 2021-05-01: 22:00:00 1 via ORAL
  Filled 2021-05-01: qty 1

## 2021-05-01 NOTE — ED Provider Notes (Signed)
Emergency Medicine Provider Triage Evaluation Note  Jaime Acevedo , a 60 y.o. male  was evaluated in triage.  Pt complains of complaining of "pimples on his left knee" for the past 2 weeks.  He reports increased itching.  Denies insect bite, change in his soap or detergent, denies any pain.  Review of Systems  Positive: Rash Negative: Fever, chills, drainage  Physical Exam  BP (!) 141/89 (BP Location: Right Arm)    Pulse 64    Temp 98 F (36.7 C) (Oral)    Resp 14    SpO2 99%  Gen:   Awake, no distress   Resp:  Normal effort  MSK:   Moves extremities without difficulty  Other:  Two abscess appearing lesions on the left thigh  Medical Decision Making  Medically screening exam initiated at 10:49 AM.  Appropriate orders placed.  Jaime Acevedo was informed that the remainder of the evaluation will be completed by another provider, this initial triage assessment does not replace that evaluation, and the importance of remaining in the ED until their evaluation is complete.     Jeanella Flattery 05/01/21 1050    Ernie Avena, MD 05/01/21 1222

## 2021-05-01 NOTE — Discharge Instructions (Addendum)
You have been seen and discharged from the emergency department.  Take oral antibiotic as directed.  Use steroid cream on the remainder of the itching rash.  Stay well-hydrated.  Follow-up with your primary provider for reevaluation and further care. Take home medications as prescribed. If you have any worsening symptoms or further concerns for your health please return to an emergency department for further evaluation.

## 2021-05-01 NOTE — ED Triage Notes (Signed)
Pt reports two weeks of "pimples on left knee". Pt reports increased itching, denies fever, drainage. Denies insect bite, change in soap or detergent. Denies pain.

## 2021-05-01 NOTE — ED Provider Notes (Signed)
[facility] EMERGENCY DEPARTMENT Provider Note   CSN: [id number] Arrival date & time: 05/01/21  0944     History Chief Complaint  Patient presents with   Abscess    Jaime Acevedo is a 60 y.o. male.  HPI  60 year old male presents emergency department with 2 abscesses to the left thigh.  Patient states for the past couple weeks he has had an itchy, pruritic rash on the bilateral thighs.  He states he tries not to itch it however when he is sleeping at night he has noticed that he has been scratching it, causing bleeding.  Over the last 2 days he has noticed worsening swelling and infection at 2 sites on the left thigh.  Denies any systemic symptoms.  He was able to "open and drain" the left-sided abscess.  Currently is not on any antibiotics.  No history of previous abscesses.  No past medical history on file.  There are no problems to display for this patient.   No past surgical history on file.     No family history on file.  Social History   Tobacco Use   Smoking status: Some Days   Smokeless tobacco: Never  Substance Use Topics   Alcohol use: Yes    Comment: occ   Drug use: No    Home Medications Prior to Admission medications   Medication Sig Start Date End Date Taking? Authorizing Provider  acetaminophen (TYLENOL) 500 MG tablet Take 500 mg by mouth every 6 (six) hours as needed for mild pain.   Yes [provider]  hydrocortisone cream 1 % Apply to affected thigh areas 2 times daily 05/01/21  Yes Makia Bossi M, DO  sulfamethoxazole-trimethoprim (BACTRIM DS) 800-160 MG tablet Take 1 tablet by mouth 2 (two) times daily for 7 days. 05/01/21 05/08/21 Yes Jimmy Stipes, Clabe Seal, DO  diphenhydrAMINE (BENADRYL) 25 mg capsule Take 1 capsule (25 mg total) by mouth every 6 (six) hours as needed for itching. Patient not taking: Reported on 05/01/2021 04/07/15 04/14/15  Corena Herter, MD  famotidine (PEPCID) 20 MG tablet Take 1 tablet (20  mg total) by mouth daily. Patient not taking: Reported on 05/01/2021 04/07/15 04/21/15  Corena Herter, MD  ketoconazole (NIZORAL) 2 % cream Apply 1 application topically daily. Patient not taking: Reported on 05/01/2021 07/20/16   Joycie Peek, PA-C  naphazoline-pheniramine (NAPHCON-A) 0.025-0.3 % ophthalmic solution Place 1 drop into both eyes every 4 (four) hours as needed for irritation. Patient not taking: Reported on 05/01/2021 07/20/16   Joycie Peek, PA-C  triamcinolone cream (KENALOG) 0.1 % Apply 1 application topically 2 (two) times daily. Patient not taking: Reported on 05/01/2021 07/20/16   Joycie Peek, PA-C    Allergies    Patient has no known allergies.  Review of Systems   Review of Systems  Constitutional:  Negative for chills, fatigue and fever.  HENT:  Negative for congestion.   Respiratory:  Negative for shortness of breath.   Cardiovascular:  Negative for chest pain.  Gastrointestinal:  Negative for abdominal pain.  Skin:  Positive for wound. Negative for rash.  Neurological:  Negative for headaches.   Physical Exam Updated Vital Signs BP 133/78    Pulse 70    Temp 98 F (36.7 C) (Oral)    Resp 18    SpO2 100%   Physical Exam Vitals and nursing note reviewed.  Constitutional:      Appearance: Normal appearance.  HENT:     Head: Normocephalic.  Mouth/Throat:     Mouth: Mucous membranes are moist.  Cardiovascular:     Rate and Rhythm: Normal rate.  Pulmonary:     Effort: Pulmonary effort is normal. No respiratory distress.  Abdominal:     Palpations: Abdomen is soft.     Tenderness: There is no abdominal tenderness.  Musculoskeletal:     Comments: Excoriated papular rash on the anterior and posterior aspect of the bilateral thighs, 2 focal abscesses on the left anterior thigh just above the knee, erythematous, indurated, both have a middle area that is scabbed over, no active drainage, no tracking cellulitis, no leg swelling  Skin:     General: Skin is warm.  Neurological:     Mental Status: He is alert and oriented to person, place, and time. Mental status is at baseline.  Psychiatric:        Mood and Affect: Mood normal.    ED Results / Procedures / Treatments   Labs (all labs ordered are listed, but only abnormal results are displayed) Labs Reviewed  AEROBIC/ANAEROBIC CULTURE W GRAM STAIN (SURGICAL/DEEP WOUND)    EKG None  Radiology No results found.  Procedures .Marland KitchenIncision and Drainage  Date/Time: 05/01/2021 9:24 PM Performed by: Rozelle Logan, DO Authorized by: Rozelle Logan, DO   Consent:    Consent obtained:  Verbal   Consent given by:  Patient   Risks discussed:  Bleeding, incomplete drainage and infection   Alternatives discussed:  No treatment Universal protocol:    Patient identity confirmed:  Verbally with patient Location:    Type:  Abscess   Location:  Lower extremity   Lower extremity location:  Leg   Leg location:  L upper leg Pre-procedure details:    Skin preparation:  Povidone-iodine Sedation:    Sedation type:  None Anesthesia:    Anesthesia method:  Local infiltration   Local anesthetic:  Lidocaine 2% w/o epi Procedure type:    Complexity:  Simple Procedure details:    Ultrasound guidance: yes     Needle aspiration: no     Incision types:  Stab incision   Incision depth:  Subcutaneous   Wound management:  Probed and deloculated and irrigated with saline   Drainage:  Purulent and serosanguinous   Drainage amount:  Moderate   Wound treatment:  Wound left open   Packing materials:  None Post-procedure details:    Procedure completion:  Tolerated .Marland KitchenIncision and Drainage  Date/Time: 05/01/2021 9:25 PM Performed by: Rozelle Logan, DO Authorized by: Rozelle Logan, DO   Consent:    Consent obtained:  Verbal   Consent given by:  Patient   Risks discussed:  Bleeding, incomplete drainage and infection   Alternatives discussed:  No treatment Universal  protocol:    Patient identity confirmed:  Verbally with patient Location:    Type:  Abscess   Location:  Lower extremity   Lower extremity location:  Leg   Leg location:  L upper leg Pre-procedure details:    Skin preparation:  Povidone-iodine Sedation:    Sedation type:  None Anesthesia:    Anesthesia method:  Local infiltration   Local anesthetic:  Lidocaine 2% w/o epi Procedure type:    Complexity:  Simple Procedure details:    Ultrasound guidance: yes     Incision types:  Stab incision   Wound management:  Probed and deloculated and irrigated with saline   Drainage:  Purulent and serosanguinous   Drainage amount:  Moderate   Wound treatment:  Wound  left open   Packing materials:  None Post-procedure details:    Procedure completion:  Tolerated   Medications Ordered in ED Medications  sulfamethoxazole-trimethoprim (BACTRIM DS) 800-160 MG per tablet 1 tablet (has no administration in time range)  lidocaine (XYLOCAINE) 2 % (with pres) injection 200 mg (200 mg Infiltration Given 05/01/21 2102)    ED Course  I have reviewed the triage vital signs and the nursing notes.  Pertinent labs & imaging results that were available during my care of the patient were reviewed by me and considered in my medical decision making (see chart for details).    MDM Rules/Calculators/A&P                          60 year old male presents emergency department with excoriated and itchy rash on his bilateral thighs and 2 areas of abscess on the left thigh.  Vitals are stable, no systemic symptoms.  2 small abscesses on the left anterior thigh just above the knee.  Bedside ultrasound shows fluid collection for both.  Both were incised and drained, moderate amount of purulent drainage.  Plan for antibiotic therapy and outpatient follow-up.  Patient at this time appears safe and stable for discharge and will be treated as an outpatient.  Discharge plan and strict return to ED precautions discussed,  patient verbalizes understanding and agreement.     Final Clinical Impression(s) / ED Diagnoses Final diagnoses:  Abscess    Rx / DC Orders ED Discharge Orders          Ordered    sulfamethoxazole-trimethoprim (BACTRIM DS) 800-160 MG tablet  2 times daily        05/01/21 2119    hydrocortisone cream 1 %        05/01/21 2119             HortonClabe Seal, DO 05/01/21 2129

## 2021-05-06 LAB — AEROBIC/ANAEROBIC CULTURE W GRAM STAIN (SURGICAL/DEEP WOUND): Gram Stain: NONE SEEN

## 2021-05-07 ENCOUNTER — Telehealth (HOSPITAL_BASED_OUTPATIENT_CLINIC_OR_DEPARTMENT_OTHER): Payer: Self-pay | Admitting: *Deleted

## 2021-05-07 NOTE — Telephone Encounter (Signed)
Post ED Visit - Positive Culture Follow-up  Culture report reviewed by antimicrobial stewardship pharmacist: [facility] []  , Pharm.D. [x]  Enzo Bi, Pharm.D., BCPS AQ-ID []  , Pharm.D., BCPS []  Celedonio Miyamoto, Pharm.D., BCPS []  [place], Garvin Fila.D., BCPS, AAHIVP []  , Pharm.D., BCPS, AAHIVP []  Georgina Pillion, PharmD, BCPS []  , PharmD, BCPS []  [place], PharmD, BCPS []  [address], PharmD []  , PharmD, BCPS []  Estella Husk, PharmD  Pharmacy Team []  Lysle Pearl, PharmD []  , PharmD []  Phillips Climes, PharmD []  , Rph []  Agapito Games) , PharmD []  Verlan Friends, PharmD []  , PharmD []  Mervyn Gay, PharmD []  , PharmD []  Vinnie Level, PharmD []  [facility], PharmD []  , PharmD []  Len Childs, PharmD   Positive aerobic/anaerovic culture Treated with Sulfamethoxazole-Trimethoprim, organism sensitive to the same and no further patient follow-up is required at this time.  05/07/2021, 11:34 AM

## 2021-07-21 ENCOUNTER — Other Ambulatory Visit: Payer: Self-pay

## 2021-07-21 ENCOUNTER — Encounter (HOSPITAL_COMMUNITY): Payer: Self-pay

## 2021-07-21 ENCOUNTER — Emergency Department (HOSPITAL_COMMUNITY)
Admission: EM | Admit: 2021-07-21 | Discharge: 2021-07-21 | Disposition: A | Discharge status: Home / Self Care | Payer: Self-pay | Attending: Emergency Medicine | Admitting: Emergency Medicine

## 2021-07-21 DIAGNOSIS — R21 Rash and other nonspecific skin eruption: Secondary | ICD-10-CM | POA: Insufficient documentation

## 2021-07-21 MED ORDER — CLOTRIMAZOLE-BETAMETHASONE 1-0.05 % EX CREA: TOPICAL_CREAM | CUTANEOUS | 1 refills | Status: AC

## 2021-07-21 MED ORDER — LORATADINE 10 MG PO TABS: 10.0000 mg | ORAL_TABLET | Freq: Every day | ORAL | 0 refills | Status: AC

## 2021-07-21 NOTE — ED Triage Notes (Signed)
Pt complaining of a rash on bilateral legs, started with 2 boils on the left knee 2 months ago and then it spread to a rash on the thighs and behind the rt knee ?

## 2021-07-21 NOTE — ED Provider Notes (Signed)
?[facility] EMERGENCY DEPARTMENT ?Provider Note ? ? ?CSN: [id number] ?Arrival date & time: 07/21/21  1946 ? ?  ? ?History ? ?Chief complaint: Rash ? ?Jaime Acevedo is a 61 y.o. male. ? ?HPI ? ?Patient presents to the emergency room for evaluation of a persistent rash.  Patient states he has had a in his lower extremities now for a couple of months.  It is very itchy.  Prior records reviewed and the patient was seen back in November of last year.  Patient was noted to have an itching rash on his bilateral thighs at that time.  Patient also had a abscess that was drained.  Patient states the itching has persisted.  He has tried multiple over-the-counter medications without relief.  He has not seen any other physicians since then.  Patient denies any history of chronic skin condition issues.  He has not had any fevers or chills ? ?Home Medications ?Prior to Admission medications   ?Medication Sig Start Date End Date Taking? Authorizing Provider  ?clotrimazole-betamethasone (LOTRISONE) cream Apply to affected area 2 times daily prn 07/21/21  Yes Linwood Dibbles, MD  ?acetaminophen (TYLENOL) 500 MG tablet Take 500 mg by mouth every 6 (six) hours as needed for mild pain.    [provider]  ?diphenhydrAMINE (BENADRYL) 25 mg capsule Take 1 capsule (25 mg total) by mouth every 6 (six) hours as needed for itching. ?Patient not taking: Reported on 05/01/2021 04/07/15 04/14/15  Corena Herter, MD  ?famotidine (PEPCID) 20 MG tablet Take 1 tablet (20 mg total) by mouth daily. ?Patient not taking: Reported on 05/01/2021 04/07/15 04/21/15  Corena Herter, MD  ?hydrocortisone cream 1 % Apply to affected thigh areas 2 times daily 05/01/21   Horton, Kristie M, DO  ?ketoconazole (NIZORAL) 2 % cream Apply 1 application topically daily. ?Patient not taking: Reported on 05/01/2021 07/20/16   Joycie Peek, PA-C  ?naphazoline-pheniramine (NAPHCON-A) 0.025-0.3 % ophthalmic solution Place 1 drop into both  eyes every 4 (four) hours as needed for irritation. ?Patient not taking: Reported on 05/01/2021 07/20/16   Joycie Peek, PA-C  ?triamcinolone cream (KENALOG) 0.1 % Apply 1 application topically 2 (two) times daily. ?Patient not taking: Reported on 05/01/2021 07/20/16   Joycie Peek, PA-C  ?   ? ?Allergies    ?Patient has no known allergies.   ? ?Review of Systems   ?Review of Systems  ?Constitutional:  Negative for fever.  ? ?Physical Exam ?Updated Vital Signs ?BP 128/87   Pulse 66   Temp 98 ?F (36.7 ?C) (Oral)   Resp 16   Ht 1.702 m (5\' 7" )   Wt 71.7 kg   SpO2 98%   BMI 24.75 kg/m?  ?Physical Exam ?Vitals and nursing note reviewed.  ?Constitutional:   ?   General: He is not in acute distress. ?   Appearance: He is well-developed.  ?HENT:  ?   Head: Normocephalic and atraumatic.  ?   Right Ear: External ear normal.  ?   Left Ear: External ear normal.  ?Eyes:  ?   General: No scleral icterus.    ?   Right eye: No discharge.     ?   Left eye: No discharge.  ?   Conjunctiva/sclera: Conjunctivae normal.  ?Neck:  ?   Trachea: No tracheal deviation.  ?Cardiovascular:  ?   Rate and Rhythm: Normal rate.  ?Pulmonary:  ?   Effort: Pulmonary effort is normal. No respiratory distress.  ?   Breath sounds: No stridor.  ?  Abdominal:  ?   General: There is no distension.  ?Musculoskeletal:     ?   General: No swelling or deformity.  ?   Cervical back: Neck supple.  ?Skin: ?   General: Skin is warm and dry.  ?   Findings: Rash present.  ?   Comments: Patient has scaling erythematous rash in the inguinal region, there is also erythematous and brownish plaques in the popliteal fossa bilaterally, evidence of lichenification in the inguinal and popliteal fossa regions  ?Neurological:  ?   Mental Status: He is alert.  ?   Cranial Nerves: Cranial nerve deficit: no gross deficits.  ? ? ?ED Results / Procedures / Treatments   ?Labs ?(all labs ordered are listed, but only abnormal results are displayed) ?Labs Reviewed - No data  to display ? ?EKG ?None ? ?Radiology ?No results found. ? ?Procedures ?Procedures  ? ? ?Medications Ordered in ED ?Medications - No data to display ? ?ED Course/ Medical Decision Making/ A&P ?  ?                        ?Medical Decision Making ?Risk ?Prescription drug management. ? ? ?Inguinal rash location is suggestive of tinea cruris however he also has plaques in the popliteal fossa more suggestive of an eczema or psoriasis type rash.  No signs of acute infection.  We will start him on Lotrisone cream.  Recommend following up with either her primary care doctor or dermatologist. ? ?Evaluation and diagnostic testing in the emergency department does not suggest an emergent condition requiring admission or immediate intervention beyond what has been performed at this time.  The patient is safe for discharge and has been instructed to return immediately for worsening symptoms, change in symptoms or any other concerns. ? ? ? ? ? ? ? ? ?Final Clinical Impression(s) / ED Diagnoses ?Final diagnoses:  ?Rash  ? ? ?Rx / DC Orders ?ED Discharge Orders   ? ?      Ordered  ?  clotrimazole-betamethasone (LOTRISONE) cream       ? 07/21/21 2233  ? ?  ?  ? ?  ? ? ?  ?Linwood Dibbles, MD ?07/21/21 2238 ? ?

## 2021-07-21 NOTE — ED Notes (Signed)
Patient verbalizes understanding of discharge instructions. Opportunity for questioning and answers were provided. Armband removed by staff, pt discharged from ED ambulatory.   

## 2021-07-21 NOTE — ED Provider Triage Note (Signed)
Emergency Medicine Provider Triage Evaluation Note ? ?Jaime Acevedo , a 61 y.o. male  was evaluated in triage.  Pt complains of rash. States that 2 months ago he had 2 abscesses lanced in the emergency department, states that soon after that he had development of a red and itchy rash is present intermittently throughout his bilateral legs.  He states that it has been persistent since then, not worsening.  States it is incredibly itchy and causing him to be unable to sleep at night.  He states that he has tried 'every over-the-counter medication you could think of' without any relief.  He denies any history of similar rashes in the past.  No known sick contacts.  Does state that he works with [employer] a [employer].  No other complaints at this time, rash is isolated to his bilateral legs. ? ?Review of Systems  ?Positive: Rash ?Negative: Fever, chills, nausea, vomiting ? ?Physical Exam  ?BP 137/88 (BP Location: Right Arm)   Pulse 73   Temp 98 ?F (36.7 ?C) (Oral)   Resp 16   Ht 5\' 7"  (1.702 m)   Wt 71.7 kg   SpO2 99%   BMI 24.75 kg/m?  ?Gen:   Awake, no distress   ?Resp:  Normal effort  ?MSK:   Moves extremities without difficulty  ?Other:  Erythematous macular rash noted intermittently throughout bilateral lower legs with scaly plaques located to posterior left popliteal fossa ? ?Medical Decision Making  ?Medically screening exam initiated at 8:20 PM.  Appropriate orders placed.  Krrish Taivon Haroon was informed that the remainder of the evaluation will be completed by another provider, this initial triage assessment does not replace that evaluation, and the importance of remaining in the ED until their evaluation is complete. ? ? ?  ?Gaylyn Rong, PA-C ?07/21/21 2024 ? ?

## 2021-07-21 NOTE — Discharge Instructions (Signed)
Apply the cream to the rash in your groin and leg area.  Follow up with a primary care doctor or consider seeing a dermatologist specialist for further evaluation ?

## 2022-02-18 ENCOUNTER — Emergency Department (HOSPITAL_COMMUNITY)
Admission: EM | Admit: 2022-02-18 | Discharge: 2022-02-19 | Disposition: A | Discharge status: Home / Self Care | Payer: Self-pay | Attending: Physician Assistant | Admitting: Physician Assistant

## 2022-02-18 ENCOUNTER — Other Ambulatory Visit: Payer: Self-pay

## 2022-02-18 ENCOUNTER — Encounter (HOSPITAL_COMMUNITY): Payer: Self-pay | Admitting: Emergency Medicine

## 2022-02-18 DIAGNOSIS — R21 Rash and other nonspecific skin eruption: Secondary | ICD-10-CM | POA: Insufficient documentation

## 2022-02-18 MED ORDER — DIPHENHYDRAMINE HCL 25 MG PO CAPS
50.0000 mg | ORAL_CAPSULE | Freq: Once | ORAL | Status: AC
  Administered 2022-02-18: 50 mg via ORAL
  Filled 2022-02-18: qty 2

## 2022-02-18 MED ORDER — DIPHENHYDRAMINE HCL 25 MG PO CAPS: 25.0000 mg | ORAL_CAPSULE | Freq: Once | ORAL | Status: DC

## 2022-02-18 NOTE — ED Provider Triage Note (Signed)
Emergency Medicine Provider Triage Evaluation Note  Jaime Acevedo , a 61 y.o. male  was evaluated in triage.  Provided via [place] interpreter.  Pt complains of hives and itching which has been ongoing for the last week and a half.  Patient states he has been itching behind his knees and in his groin which has been ongoing for the last week and a half.  He has not taken anything for symptoms.  He denies any throat swelling, difficulty breathing, drooling.  He does not know what could have caused this, no new foods or soaps.  Denies any tick bites.  Denies any fevers or chills.  He has been taking some kind of cream but he cannot recall the name.  This has not been helping.  Review of Systems  Positive: As above Negative: As above Physical Exam  There were no vitals taken for this visit. Gen:   Awake, no distress   Resp:  Normal effort  MSK:   Moves extremities without difficulty  Other:  Unable to assess skin as the patient is wearing jeans and is unable to raise them high enough for me to examine him.  He is speaking in full sentences, no evidence of angioedema, no throat swelling, no tripoding, no wheezing.  Medical Decision Making  Medically screening exam initiated at 11:32 PM.  Appropriate orders placed.  Jaime Acevedo was informed that the remainder of the evaluation will be completed by another provider, this initial triage assessment does not replace that evaluation, and the importance of remaining in the ED until their evaluation is complete.     Jaime Balding, PA-C 02/18/22 2334

## 2022-02-18 NOTE — ED Triage Notes (Signed)
Patient reports persistent itchy rashes/hives at bilateral legs for 1 1/2 weeks .

## 2022-02-19 MED ORDER — HYDROCORTISONE 1 % EX CREA: TOPICAL_CREAM | CUTANEOUS | 0 refills | Status: AC

## 2022-02-19 NOTE — Discharge Instructions (Addendum)
Utilice una crema con esteroides en las reas [employer] veces al da. Haga un seguimiento con [facility], que es una clnica gratuita en el rea, llame al nmero de telfono y programe una cita. Regrese a la sala de emergencias ante cualquier sntoma nuevo o que empeore.  Please use a steroid cream to the affected areas twice daily.  Please follow-up with [facility] which is a free clinic in the area, please call the phone number and schedule an appointment.  Return to the ER for any new or worsening symptoms.

## 2022-02-19 NOTE — ED Provider Notes (Signed)
[facility] EMERGENCY DEPARTMENT Provider Note   CSN: [id number] Arrival date & time: 02/18/22  2320     History  Chief Complaint  Patient presents with   Legs Rashes/Hives    Jaime Acevedo is a 61 y.o. male.  HPI 61 year old male with no significant medical history presents to the ER with complaints of a rash.  History provided via via [place] interpreter.  Pt complains of hives and itching which has been ongoing for the last week and a half.  Patient states he has been itching behind his knees and in his groin which has been ongoing for the last week and a half.  He has not taken anything for symptoms.  He denies any throat swelling, difficulty breathing, drooling.  He does not know what could have caused this, no new foods or soaps.  Denies any tick bites.  Denies any fevers or chills.  He has been taking some kind of cream but he cannot recall the name.  This has not been helping.      Home Medications Prior to Admission medications   Medication Sig Start Date End Date Taking? Authorizing Provider  acetaminophen (TYLENOL) 500 MG tablet Take 500 mg by mouth every 6 (six) hours as needed for mild pain.    [provider]  clotrimazole-betamethasone (LOTRISONE) cream Apply to affected area 2 times daily prn 07/21/21   Dorie Rank, MD  diphenhydrAMINE (BENADRYL) 25 mg capsule Take 1 capsule (25 mg total) by mouth every 6 (six) hours as needed for itching. Patient not taking: Reported on 05/01/2021 04/07/15 04/14/15  Nathaniel Man, MD  famotidine (PEPCID) 20 MG tablet Take 1 tablet (20 mg total) by mouth daily. Patient not taking: Reported on 05/01/2021 04/07/15 04/21/15  Nathaniel Man, MD  hydrocortisone cream 1 % Apply to affected areas behind knee and groin 2 times daily 02/19/22   Garald Balding, PA-C  ketoconazole (NIZORAL) 2 % cream Apply 1 application topically daily. Patient not taking: Reported on 05/01/2021 07/20/16   Comer Locket,  PA-C  loratadine (CLARITIN) 10 MG tablet Take 1 tablet (10 mg total) by mouth daily. 07/21/21   Dorie Rank, MD  naphazoline-pheniramine (NAPHCON-A) 0.025-0.3 % ophthalmic solution Place 1 drop into both eyes every 4 (four) hours as needed for irritation. Patient not taking: Reported on 05/01/2021 07/20/16   Comer Locket, PA-C  triamcinolone cream (KENALOG) 0.1 % Apply 1 application topically 2 (two) times daily. Patient not taking: Reported on 05/01/2021 07/20/16   Comer Locket, PA-C      Allergies    Patient has no known allergies.    Review of Systems   Review of Systems Ten systems reviewed and are negative for acute change, except as noted in the HPI.   Physical Exam Updated Vital Signs BP (!) 150/92 (BP Location: Right Arm)   Pulse (!) 58   Temp 97.9 F (36.6 C)   Resp 18   SpO2 99%  Physical Exam Vitals reviewed.  Constitutional:      Appearance: Normal appearance.  HENT:     Head: Normocephalic and atraumatic.  Eyes:     General:        Right eye: No discharge.        Left eye: No discharge.     Extraocular Movements: Extraocular movements intact.     Conjunctiva/sclera: Conjunctivae normal.  Musculoskeletal:        General: No swelling. Normal range of motion.  Skin:    Findings: Rash present.  Comments: Large plaque like scaly rash to the gluteal area of the left and right leg as well as small areas of plaques in the groin.  No evidence of bullae, sloughing, blisters, pustules, no warmth, draining sinus tracts, no superficial abscesses, no vesicles, desquamation, no target lesions with dusky purpura or central bulla.  Not tender to touch.     Neurological:     General: No focal deficit present.     Mental Status: He is alert and oriented to person, place, and time.  Psychiatric:        Mood and Affect: Mood normal.        Behavior: Behavior normal.     ED Results / Procedures / Treatments   Labs (all labs ordered are listed, but only abnormal  results are displayed) Labs Reviewed - No data to display  EKG None  Radiology No results found.  Procedures Procedures    Medications Ordered in ED Medications  diphenhydrAMINE (BENADRYL) capsule 50 mg (50 mg Oral Given 02/18/22 2335)    ED Course/ Medical Decision Making/ A&P                           Medical Decision Making  61 year old male presenting with concerns for rash/hives, however on my exam the rash does not appear urticarial but rather more so psoriatic like with large scaly plaques.  Per chart review, seems to have had presentations similar to this in the past.  Will provide steroid cream, and refer to [facility] wellness as the patient is uninsured for further management of suspected psoriasis.  No evidence of SJS, TN, recommend spotted fever, Lyme disease.  Gust return precautions.  He voiced understanding and is agreeable.  Stable for discharge.    Final Clinical Impression(s) / ED Diagnoses Final diagnoses:  Rash    Rx / DC Orders ED Discharge Orders          Ordered    hydrocortisone cream 1 %        02/19/22 0449              Garald Balding, PA-C 02/19/22 0451    Quintella Reichert, MD 02/19/22 [phone]
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
## 2023-08-25 ENCOUNTER — Emergency Department (HOSPITAL_COMMUNITY): Payer: Self-pay

## 2023-08-25 ENCOUNTER — Emergency Department (HOSPITAL_COMMUNITY)
Admission: EM | Admit: 2023-08-25 | Discharge: 2023-08-26 | Disposition: A | Discharge status: Home / Self Care | Payer: Self-pay | Attending: Emergency Medicine | Admitting: Emergency Medicine

## 2023-08-25 DIAGNOSIS — T07XXXA Unspecified multiple injuries, initial encounter: Secondary | ICD-10-CM

## 2023-08-25 DIAGNOSIS — S0081XA Abrasion of other part of head, initial encounter: Secondary | ICD-10-CM | POA: Insufficient documentation

## 2023-08-25 DIAGNOSIS — M542 Cervicalgia: Secondary | ICD-10-CM | POA: Insufficient documentation

## 2023-08-25 DIAGNOSIS — M25552 Pain in left hip: Secondary | ICD-10-CM | POA: Insufficient documentation

## 2023-08-25 DIAGNOSIS — M79662 Pain in left lower leg: Secondary | ICD-10-CM | POA: Insufficient documentation

## 2023-08-25 LAB — CBC WITH DIFFERENTIAL/PLATELET
Abs Immature Granulocytes: 0.06 10*3/uL (ref 0.00–0.07)
Basophils Absolute: 0.1 10*3/uL (ref 0.0–0.1)
Basophils Relative: 1 %
Eosinophils Absolute: 0.4 10*3/uL (ref 0.0–0.5)
Eosinophils Relative: 3 %
HCT: 40.1 % (ref 39.0–52.0)
Hemoglobin: 13.1 g/dL (ref 13.0–17.0)
Immature Granulocytes: 1 %
Lymphocytes Relative: 7 %
Lymphs Abs: 0.9 10*3/uL (ref 0.7–4.0)
MCH: 31.3 pg (ref 26.0–34.0)
MCHC: 32.7 g/dL (ref 30.0–36.0)
MCV: 95.7 fL (ref 80.0–100.0)
Monocytes Absolute: 0.9 10*3/uL (ref 0.1–1.0)
Monocytes Relative: 7 %
Neutro Abs: 10.4 10*3/uL — ABNORMAL HIGH (ref 1.7–7.7)
Neutrophils Relative %: 81 %
Platelets: 371 10*3/uL (ref 150–400)
RBC: 4.19 MIL/uL — ABNORMAL LOW (ref 4.22–5.81)
RDW: 12.8 % (ref 11.5–15.5)
WBC: 12.7 10*3/uL — ABNORMAL HIGH (ref 4.0–10.5)
nRBC: 0 % (ref 0.0–0.2)

## 2023-08-25 LAB — PROTIME-INR
INR: 1.1 (ref 0.8–1.2)
Prothrombin Time: 14.1 s (ref 11.4–15.2)

## 2023-08-25 MED ORDER — SODIUM CHLORIDE 0.9 % IV SOLN: 250.0000 mL | INTRAVENOUS | Status: DC | PRN

## 2023-08-25 MED ORDER — ONDANSETRON HCL 4 MG/2ML IJ SOLN
4.0000 mg | Freq: Once | INTRAMUSCULAR | Status: AC
  Administered 2023-08-25: 4 mg via INTRAVENOUS
  Filled 2023-08-25: qty 2

## 2023-08-25 MED ORDER — SODIUM CHLORIDE 0.9% FLUSH
3.0000 mL | Freq: Two times a day (BID) | INTRAVENOUS | Status: DC
  Administered 2023-08-25: 3 mL via INTRAVENOUS

## 2023-08-25 MED ORDER — MORPHINE SULFATE (PF) 2 MG/ML IV SOLN
2.0000 mg | Freq: Once | INTRAVENOUS | Status: AC
  Administered 2023-08-25: 2 mg via INTRAVENOUS
  Filled 2023-08-25: qty 1

## 2023-08-25 MED ORDER — SODIUM CHLORIDE 0.9% FLUSH: 3.0000 mL | INTRAVENOUS | Status: DC | PRN
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
## 2023-08-26 ENCOUNTER — Emergency Department (HOSPITAL_COMMUNITY): Payer: Self-pay

## 2023-08-26 LAB — COMPREHENSIVE METABOLIC PANEL WITH GFR
ALT: 20 U/L (ref 0–44)
AST: 22 U/L (ref 15–41)
Albumin: 3.7 g/dL (ref 3.5–5.0)
Alkaline Phosphatase: 59 U/L (ref 38–126)
Anion gap: 13 (ref 5–15)
BUN: 10 mg/dL (ref 6–20)
CO2: 20 mmol/L — ABNORMAL LOW (ref 22–32)
Calcium: 8.8 mg/dL — ABNORMAL LOW (ref 8.9–10.3)
Chloride: 104 mmol/L (ref 98–111)
Creatinine, Ser: 0.85 mg/dL (ref 0.61–1.24)
GFR, Estimated: >60 mL/min (ref >60)
Glucose, Bld: 91 mg/dL (ref 70–99)
Potassium: 3.5 mmol/L (ref 3.5–5.1)
Sodium: 137 mmol/L (ref 135–145)
Total Bilirubin: 0.6 mg/dL (ref 0.0–1.2)
Total Protein: 6.2 g/dL — ABNORMAL LOW (ref 6.5–8.1)

## 2023-08-26 LAB — ETHANOL: Alcohol, Ethyl (B): 34 mg/dL — ABNORMAL HIGH (ref <15)

## 2023-08-26 MED ORDER — IBUPROFEN 800 MG PO TABS: 800.0000 mg | ORAL_TABLET | Freq: Three times a day (TID) | ORAL | 0 refills | Status: AC

## 2023-08-26 MED ORDER — KETOROLAC TROMETHAMINE 30 MG/ML IJ SOLN
30.0000 mg | Freq: Once | INTRAMUSCULAR | Status: AC
  Administered 2023-08-26: 30 mg via INTRAVENOUS
  Filled 2023-08-26: qty 1

## 2023-08-26 MED ORDER — MORPHINE SULFATE (PF) 4 MG/ML IV SOLN: 4.0000 mg | Freq: Once | INTRAVENOUS | Status: DC

## 2023-08-26 NOTE — ED Notes (Signed)
[place] J collar applied 

## 2023-08-26 NOTE — Discharge Instructions (Addendum)
 As we discussed, he can continue to take ibuprofen for pain and use crutches to take weight off the left leg to be able to move.  Otherwise rest is much as possible, use ice to the affected area to reduce pain.
